# Patient Record
Sex: Male | Born: 1985 | Race: Black or African American | Hispanic: No | Marital: Single | Smoking: Current every day smoker
Health system: Southern US, Community
[De-identification: ages and names within clinical notes are randomized; demographics above are authoritative.]

## PROBLEM LIST (undated history)

## (undated) DIAGNOSIS — I341 Nonrheumatic mitral (valve) prolapse: Secondary | ICD-10-CM

---

## 2018-04-12 ENCOUNTER — Encounter (HOSPITAL_COMMUNITY): Payer: Self-pay

## 2018-04-12 ENCOUNTER — Emergency Department (HOSPITAL_COMMUNITY)
Admission: EM | Admit: 2018-04-12 | Discharge: 2018-04-12 | Disposition: A | Discharge status: Home / Self Care | Payer: BLUE CROSS/BLUE SHIELD | Attending: Emergency Medicine | Admitting: Emergency Medicine

## 2018-04-12 ENCOUNTER — Emergency Department (HOSPITAL_COMMUNITY): Payer: BLUE CROSS/BLUE SHIELD

## 2018-04-12 DIAGNOSIS — Y92411 Interstate highway as the place of occurrence of the external cause: Secondary | ICD-10-CM | POA: Diagnosis not present

## 2018-04-12 DIAGNOSIS — Y999 Unspecified external cause status: Secondary | ICD-10-CM | POA: Insufficient documentation

## 2018-04-12 DIAGNOSIS — F17210 Nicotine dependence, cigarettes, uncomplicated: Secondary | ICD-10-CM | POA: Diagnosis not present

## 2018-04-12 DIAGNOSIS — S46811A Strain of other muscles, fascia and tendons at shoulder and upper arm level, right arm, initial encounter: Secondary | ICD-10-CM | POA: Insufficient documentation

## 2018-04-12 DIAGNOSIS — S4991XA Unspecified injury of right shoulder and upper arm, initial encounter: Secondary | ICD-10-CM | POA: Diagnosis present

## 2018-04-12 DIAGNOSIS — Y9389 Activity, other specified: Secondary | ICD-10-CM | POA: Insufficient documentation

## 2018-04-12 MED ORDER — CYCLOBENZAPRINE HCL 10 MG PO TABS
10.0000 mg | ORAL_TABLET | Freq: Once | ORAL | Status: AC
  Administered 2018-04-12: 10 mg via ORAL
  Filled 2018-04-12: qty 1

## 2018-04-12 MED ORDER — CYCLOBENZAPRINE HCL 10 MG PO TABS: 10.0000 mg | ORAL_TABLET | Freq: Three times a day (TID) | ORAL | 0 refills | Status: AC

## 2018-04-12 MED ORDER — IBUPROFEN 400 MG PO TABS
600.0000 mg | ORAL_TABLET | Freq: Once | ORAL | Status: AC
  Administered 2018-04-12: 600 mg via ORAL
  Filled 2018-04-12: qty 1
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
## 2018-11-15 ENCOUNTER — Emergency Department (HOSPITAL_COMMUNITY): Payer: 59

## 2018-11-15 ENCOUNTER — Emergency Department (HOSPITAL_COMMUNITY)
Admission: EM | Admit: 2018-11-15 | Discharge: 2018-11-15 | Disposition: A | Discharge status: Home / Self Care | Payer: 59 | Attending: Emergency Medicine | Admitting: Emergency Medicine

## 2018-11-15 ENCOUNTER — Encounter (HOSPITAL_COMMUNITY): Payer: Self-pay | Admitting: *Deleted

## 2018-11-15 DIAGNOSIS — R0789 Other chest pain: Secondary | ICD-10-CM | POA: Diagnosis not present

## 2018-11-15 DIAGNOSIS — F1210 Cannabis abuse, uncomplicated: Secondary | ICD-10-CM | POA: Insufficient documentation

## 2018-11-15 DIAGNOSIS — R002 Palpitations: Secondary | ICD-10-CM | POA: Diagnosis not present

## 2018-11-15 DIAGNOSIS — M62838 Other muscle spasm: Secondary | ICD-10-CM

## 2018-11-15 DIAGNOSIS — F17210 Nicotine dependence, cigarettes, uncomplicated: Secondary | ICD-10-CM | POA: Diagnosis not present

## 2018-11-15 DIAGNOSIS — M79602 Pain in left arm: Secondary | ICD-10-CM | POA: Diagnosis not present

## 2018-11-15 DIAGNOSIS — M542 Cervicalgia: Secondary | ICD-10-CM | POA: Insufficient documentation

## 2018-11-15 DIAGNOSIS — M549 Dorsalgia, unspecified: Secondary | ICD-10-CM | POA: Insufficient documentation

## 2018-11-15 HISTORY — DX: Nonrheumatic mitral (valve) prolapse: I34.1

## 2018-11-15 LAB — CBC
HCT: 43.7 % (ref 39.0–52.0)
Hemoglobin: 14.2 g/dL (ref 13.0–17.0)
MCH: 29.5 pg (ref 26.0–34.0)
MCHC: 32.5 g/dL (ref 30.0–36.0)
MCV: 90.7 fL (ref 80.0–100.0)
Platelets: 184 10*3/uL (ref 150–400)
RBC: 4.82 MIL/uL (ref 4.22–5.81)
RDW: 13.5 % (ref 11.5–15.5)
WBC: 7 10*3/uL (ref 4.0–10.5)
nRBC: 0 % (ref 0.0–0.2)

## 2018-11-15 LAB — BASIC METABOLIC PANEL
Anion gap: 7 (ref 5–15)
BUN: 9 mg/dL (ref 6–20)
CO2: 24 mmol/L (ref 22–32)
Calcium: 8.5 mg/dL — ABNORMAL LOW (ref 8.9–10.3)
Chloride: 107 mmol/L (ref 98–111)
Creatinine, Ser: 0.95 mg/dL (ref 0.61–1.24)
GFR calc Af Amer: >60 mL/min (ref >60)
GFR calc non Af Amer: >60 mL/min (ref >60)
Glucose, Bld: 99 mg/dL (ref 70–99)
Potassium: 3.8 mmol/L (ref 3.5–5.1)
Sodium: 138 mmol/L (ref 135–145)

## 2018-11-15 LAB — TROPONIN I (HIGH SENSITIVITY)
Troponin I (High Sensitivity): 4 ng/L (ref <18)
Troponin I (High Sensitivity): 4 ng/L (ref <18)

## 2018-11-15 MED ORDER — SODIUM CHLORIDE 0.9% FLUSH: 3.0000 mL | Freq: Once | INTRAVENOUS | Status: DC

## 2018-11-15 MED ORDER — METHOCARBAMOL 500 MG PO TABS: 500.0000 mg | ORAL_TABLET | Freq: Two times a day (BID) | ORAL | 0 refills | Status: AC

## 2018-11-15 NOTE — ED Triage Notes (Signed)
Pt is here for CP which began this am with radiation to left side of neck, pt thought he had slept wrong.  During the day the pain increased and pain is radiating across chest and into left shoulder blade and down left arm.  Pt is tearful, he states that his mother and sister have mitral valve prolapse and he has been told he has this too.  Pt states that has a little bit of sob with this.  No weakness, no n/v.  Pt is alert and oriented.

## 2018-11-15 NOTE — ED Provider Notes (Signed)
[facility] EMERGENCY DEPARTMENT Provider Note   CSN: [id number] Arrival date & time: 11/15/18  1615     History   Chief Complaint Chief Complaint  Patient presents with  . Chest Pain    HPI Ezriel Harben is a 33 y.o. male.     HPI   Pt is a 33 y/o male with a h/o MVP who presents to the ED today for eval of neck and chest pain.  Patient states that he woke up today and had pain to the left neck/trapezius area.  He then developed pain to the left upper back, left arm and left side of his chest.  He became concerned when the pain moved into his chest.  States chest pain has improved since onset. Chest pain was nonexertional and actually worse at rest and with palpation. he still has pain in his neck. Rates neck pain 4/10. When he lifts his head up off the pillow and turning to the left his pain is worse. He has a h/o neck spasms and thinks that this pain feels similar. 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   He reports he has had palpitations for a few months.  He has a history of mitral valve prolapse but states that usually does not give him trouble.  Episodes are intermittent.  Symptoms are multiple episodes per day and sometimes he will not have any symptoms.  Denies any associated chest pain, shortness of breath, pain with inspiration.  Denies NVD, abd pain, cough or fevers. Denies leg pain/swelling, hemoptysis, recent surgery/trauma, recent long travel, hormone use, personal hx of cancer, or hx of DVT/PE.  Denies any known COVID exposures.   He does not use tobacco, but he does vape and use marijuana. Uses ETOH rarely. Denies h/o HTN, HLD, or diabetes.  Denies early family hx of heart disease or sudden cardiac death.   Past Medical History:  Diagnosis Date  . Mitral valve prolapse     There are no active problems to display for this patient.   History reviewed. No pertinent surgical history.      Home  Medications    Prior to Admission medications   Medication Sig Start Date End Date Taking? Authorizing Provider  methocarbamol (ROBAXIN) 500 MG tablet Take 1 tablet (500 mg total) by mouth 2 (two) times daily for 3 days. 11/15/18 11/18/18  Sharlisa Hollifield S, PA-C    Family History No family history on file.  Social History Social History   Tobacco Use  . Smoking status: Current Every Day Smoker    Packs/day: 0.50    Types: Cigarettes  . Smokeless tobacco: Never Used  Substance Use Topics  . Alcohol use: Yes    Comment: occ  . Drug use: Yes    Types: Marijuana    Comment: occ     Allergies   Patient has no known allergies.   Review of Systems Review of Systems  Constitutional: Negative for chills and fever.  HENT: Negative for ear pain and sore throat.   Eyes: Negative for visual disturbance.  Respiratory: Negative for cough and shortness of breath.   Cardiovascular: Positive for chest pain (resolved) and palpitations. Negative for leg swelling.  Gastrointestinal: Negative for abdominal pain, constipation, diarrhea, nausea and vomiting.  Genitourinary: Negative for dysuria and hematuria.  Musculoskeletal: Positive for back pain and neck pain.       Left shoulder/arm pain  Skin: Negative for rash.  Neurological: Negative for weakness, light-headedness and headaches.  All other systems reviewed and are negative.    Physical Exam Updated Vital Signs BP (!) 149/95   Pulse (!) 54   Temp 98.6 F (37 C) (Oral)   Resp 13   Wt (!) 140.2 kg   SpO2 100%   Physical Exam Vitals signs and nursing note reviewed.  Constitutional:      General: He is not in acute distress.    Appearance: He is well-developed. He is not ill-appearing or toxic-appearing.  HENT:     Head: Normocephalic and atraumatic.  Eyes:     Conjunctiva/sclera: Conjunctivae normal.  Neck:     Musculoskeletal: Neck supple.  Cardiovascular:     Rate and Rhythm: Normal rate and regular rhythm.      Heart sounds: Normal heart sounds. No murmur.  Pulmonary:     Effort: Pulmonary effort is normal. No respiratory distress.     Breath sounds: Normal breath sounds. No decreased breath sounds, wheezing, rhonchi or rales.  Chest:     Chest wall: Tenderness (along left sternal border (reproduces pain)) present.  Abdominal:     Palpations: Abdomen is soft.     Tenderness: There is no abdominal tenderness.  Musculoskeletal:     Right lower leg: He exhibits no tenderness. No edema.     Left lower leg: He exhibits no tenderness. No edema.     Comments: TTP to the left trapezius muscle and along the left scapula which reproduces pain.  No midline cervical, thoracic or lumbar tenderness.  Skin:    General: Skin is warm and dry.  Neurological:     Mental Status: He is alert.      ED Treatments / Results  Labs (all labs ordered are listed, but only abnormal results are displayed) Labs Reviewed  BASIC METABOLIC PANEL - Abnormal; Notable for the following components:      Result Value   Calcium 8.5 (*)    All other components within normal limits  CBC  TROPONIN I (HIGH SENSITIVITY)  TROPONIN I (HIGH SENSITIVITY)    EKG EKG Interpretation  Date/Time:  Monday November 15 2018 21:17:57 EDT Ventricular Rate:  51 PR Interval:    QRS Duration: 89 QT Interval:  435 QTC Calculation: 401 R Axis:   33 Text Interpretation:  Sinus rhythm Consider left atrial enlargement T wave abnormality Artifact Abnormal ECG Confirmed by Gerhard MunchLockwood, Robert [phone]) on 11/15/2018 9:31:33 PM   Radiology Dg Chest 2 View  Result Date: 11/15/2018 CLINICAL DATA:  Acute chest pain today EXAM: CHEST - 2 VIEW COMPARISON:  04/12/2018 FINDINGS: The cardiomediastinal silhouette is unremarkable. There is no evidence of focal airspace disease, pulmonary edema, suspicious pulmonary nodule/mass, pleural effusion, or pneumothorax. No acute bony abnormalities are identified. IMPRESSION: No active cardiopulmonary disease.  Electronically Signed   By: Harmon PierJeffrey  Hu M.D.   On: 11/15/2018 17:24    Procedures Procedures (including critical care time)  Medications Ordered in ED Medications  sodium chloride flush (NS) 0.9 % injection 3 mL (has no administration in time range)     Initial Impression / Assessment and Plan / ED Course  I have reviewed the triage vital signs and the nursing notes.  Pertinent labs & imaging results that were available during my care of the patient were reviewed by me and considered in my medical decision making (see chart for details).     Final Clinical Impressions(s) / ED Diagnoses   Final diagnoses:  Chest wall pain  Muscle  spasm  Palpitations   33 year old male presenting left upper back pain/neck pain that started today.  Later also developed left-sided chest pain which has since resolved.  On exam, patient nontoxic nonseptic appearing.  No acute distress.  He has chest tenderness along the left sternal border which reproduces his symptoms.  His lungs are clear to auscultation bilaterally.  Heart with regular rate and rhythm.  He also has tenderness over the left trapezius muscles and along this border of the left scapula which reproduces his symptoms.  Reviewed labs, CBC without leukocytosis.  No anemia.  Electrolytes within normal limits.  Normal creatinine.  Delta troponins negative x2.  EKG with NSR, LAE, nonspecific Twave abnormality. No ischemic changes. No stemi  CXR negative  Patient work-up is reassuring.  Highly doubt PE, he has no risk factors and is PERC negative.  Symptoms seem very atypical for ACS, and patient low risk.  I can reproduce his chest tenderness as well as tenderness neck pain.  I believe that this is musculoskeletal in nature.  Will give Rx for Robaxin.  With regard to his palpitations, suspect this may be related to his mitral valve prolapse.  Will give follow-up with cardiology for potential Holter monitor.  We will also get follow-up with PCP.   Advised him to follow-up and to return if worse.  He voices understanding and is in agreement plan.  All questions answered.  Patient stable for discharge.  ED Discharge Orders         Ordered    methocarbamol (ROBAXIN) 500 MG tablet  2 times daily     11/15/18 2248           Rodney Booze, PA-C 11/15/18 2248    Carmin Muskrat, MD 11/17/18 0003

## 2018-11-15 NOTE — Discharge Instructions (Signed)
You were given a prescription for Robaxin which is a muscle relaxer.  You should not drive, work, or operate machinery while taking this medication as it can make you very drowsy.    With regard to your palpitations, you are given information to follow-up with a cardiologist.  Please call the office to schedule an appointment for follow-up.  You are also given information to follow-up with your regular doctor.  Please call the [facility] to schedule appointment for follow-up.  Please return the emergency department for any worsening symptoms including persistent palpitations, shortness of breath, chest pain.
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
